# Patient Record
Sex: Male | Born: 1980 | Race: White | Hispanic: No | Marital: Single | Smoking: Former smoker
Health system: Southern US, Community
[De-identification: ages and names within clinical notes are randomized; demographics above are authoritative.]

## PROBLEM LIST (undated history)

## (undated) DIAGNOSIS — F909 Attention-deficit hyperactivity disorder, unspecified type: Secondary | ICD-10-CM

## (undated) DIAGNOSIS — M542 Cervicalgia: Secondary | ICD-10-CM

## (undated) DIAGNOSIS — B351 Tinea unguium: Secondary | ICD-10-CM

## (undated) DIAGNOSIS — S43006A Unspecified dislocation of unspecified shoulder joint, initial encounter: Secondary | ICD-10-CM

## (undated) DIAGNOSIS — H7191 Unspecified cholesteatoma, right ear: Secondary | ICD-10-CM

## (undated) DIAGNOSIS — F419 Anxiety disorder, unspecified: Secondary | ICD-10-CM

## (undated) HISTORY — DX: Cervicalgia: M54.2

## (undated) HISTORY — DX: Tinea unguium: B35.1

## (undated) HISTORY — DX: Attention-deficit hyperactivity disorder, unspecified type: F90.9

## (undated) HISTORY — DX: Unspecified dislocation of unspecified shoulder joint, initial encounter: S43.006A

## (undated) HISTORY — DX: Unspecified cholesteatoma, right ear: H71.91

## (undated) HISTORY — DX: Anxiety disorder, unspecified: F41.9

## (undated) HISTORY — PX: TYMPANOPLASTY W/ MASTOIDECTOMY: SUR1400

## (undated) HISTORY — PX: TONSILLECTOMY: SHX5217

---

## 2007-09-10 ENCOUNTER — Emergency Department (HOSPITAL_COMMUNITY): Admission: EM | Admit: 2007-09-10 | Discharge: 2007-09-11 | Payer: Self-pay | Admitting: Emergency Medicine

## 2008-05-30 ENCOUNTER — Ambulatory Visit: Payer: Self-pay | Admitting: Family Medicine

## 2008-05-30 DIAGNOSIS — S43109A Unspecified dislocation of unspecified acromioclavicular joint, initial encounter: Secondary | ICD-10-CM | POA: Insufficient documentation

## 2008-05-30 DIAGNOSIS — M542 Cervicalgia: Secondary | ICD-10-CM

## 2008-05-30 DIAGNOSIS — H7120 Cholesteatoma of mastoid, unspecified ear: Secondary | ICD-10-CM | POA: Insufficient documentation

## 2008-05-30 DIAGNOSIS — F341 Dysthymic disorder: Secondary | ICD-10-CM

## 2008-05-30 DIAGNOSIS — B351 Tinea unguium: Secondary | ICD-10-CM | POA: Insufficient documentation

## 2008-06-24 ENCOUNTER — Ambulatory Visit: Payer: Self-pay | Admitting: Family Medicine

## 2008-06-24 DIAGNOSIS — F411 Generalized anxiety disorder: Secondary | ICD-10-CM | POA: Insufficient documentation

## 2008-06-24 DIAGNOSIS — L719 Rosacea, unspecified: Secondary | ICD-10-CM | POA: Insufficient documentation

## 2008-06-29 ENCOUNTER — Telehealth: Payer: Self-pay | Admitting: Family Medicine

## 2010-06-02 ENCOUNTER — Ambulatory Visit: Admit: 2010-06-02 | Payer: Self-pay | Admitting: Family Medicine

## 2010-12-29 ENCOUNTER — Ambulatory Visit (HOSPITAL_BASED_OUTPATIENT_CLINIC_OR_DEPARTMENT_OTHER)
Admission: RE | Admit: 2010-12-29 | Discharge: 2010-12-29 | Disposition: A | Discharge status: Home / Self Care | Payer: BC Managed Care – PPO | Source: Ambulatory Visit | Attending: Family Medicine | Admitting: Family Medicine

## 2010-12-29 ENCOUNTER — Ambulatory Visit (INDEPENDENT_AMBULATORY_CARE_PROVIDER_SITE_OTHER): Payer: BC Managed Care – PPO | Admitting: Family Medicine

## 2010-12-29 ENCOUNTER — Encounter: Payer: Self-pay | Admitting: Family Medicine

## 2010-12-29 ENCOUNTER — Ambulatory Visit: Payer: Self-pay | Admitting: Family Medicine

## 2010-12-29 VITALS — BP 112/75 | HR 84 | Temp 97.6°F | Ht 73.0 in | Wt 225.0 lb

## 2010-12-29 DIAGNOSIS — M25519 Pain in unspecified shoulder: Secondary | ICD-10-CM

## 2010-12-29 DIAGNOSIS — M542 Cervicalgia: Secondary | ICD-10-CM

## 2010-12-29 DIAGNOSIS — M47812 Spondylosis without myelopathy or radiculopathy, cervical region: Secondary | ICD-10-CM | POA: Insufficient documentation

## 2010-12-29 DIAGNOSIS — Z87828 Personal history of other (healed) physical injury and trauma: Secondary | ICD-10-CM

## 2010-12-29 DIAGNOSIS — M25512 Pain in left shoulder: Secondary | ICD-10-CM

## 2010-12-29 MED ORDER — CYCLOBENZAPRINE HCL 10 MG PO TABS: 10.0000 mg | ORAL_TABLET | Freq: Three times a day (TID) | ORAL | Status: AC | PRN

## 2010-12-29 MED ORDER — MELOXICAM 15 MG PO TABS: 15.0000 mg | ORAL_TABLET | Freq: Every day | ORAL | Status: AC

## 2010-12-29 NOTE — Patient Instructions (Signed)
Your history and exam are consistent with left shoulder rotator cuff impingement and mild multidirectional instability. Try to avoid painful activities (overhead activities, lifting with extended arm) as much as possible. Meloxicam 15mg  daily with food for pain and inflammation. Physical therapy with transition to home exercise program for rotator cuff strengthening and scapular stabilization exercises. Subacromial injection may be beneficial to help with pain and to decrease inflammation if not improving as expected over next 4-6 weeks. If not improving at follow-up we will consider ultrasound, injection, and/or MRI.  Your neck should improve with physical therapy and anti-inflammatories as well. Your exam and history do not suggest bulging or herniated disc or a pinched nerve at this point. Get the x-rays of your neck - I will call you to go over these. If not improving with physical therapy the next step would be an MRI. Can consider cervical collar if severely painful. Heat 15 minutes at a time 3-4 times a day to help with spasms. Watch head position when on computers, texting, when sleeping in bed - should in line with back to prevent further nerve traction and irritation. Consider home traction unit if you get benefit with this in physical therapy.

## 2010-12-31 ENCOUNTER — Encounter: Payer: Self-pay | Admitting: Family Medicine

## 2010-12-31 DIAGNOSIS — M25512 Pain in left shoulder: Secondary | ICD-10-CM | POA: Insufficient documentation
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
## 2010-12-31 NOTE — Assessment & Plan Note (Signed)
findings and history suggest rotator cuff impingement.  Rotator cuff strength grossly is 5/5 but has + painful arc and neers.  Testing for labral tear, biceps tendinopathy, subluxation, rotator cuff tear negative.  He should do well with nsaid + physical therapy and home exercise program.  Advised him if not improving can consider subacromial cortisone injection in about 4-6 weeks.  See instructions for further.
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
## 2011-02-16 LAB — CBC
Platelets: 278
RBC: 4.98

## 2011-02-16 LAB — DIFFERENTIAL
Basophils Absolute: 0
Basophils Relative: 0
Eosinophils Absolute: 0.1
Eosinophils Relative: 2
Monocytes Absolute: 0.7
Neutro Abs: 4.6

## 2011-02-16 LAB — COMPREHENSIVE METABOLIC PANEL
Albumin: 4.6
Alkaline Phosphatase: 60
BUN: 16
Chloride: 106
Potassium: 4.1
Total Bilirubin: 1.2

## 2011-02-16 LAB — APTT: aPTT: 33

## 2011-09-01 ENCOUNTER — Ambulatory Visit (INDEPENDENT_AMBULATORY_CARE_PROVIDER_SITE_OTHER): Payer: BC Managed Care – PPO | Admitting: Family Medicine

## 2011-09-01 ENCOUNTER — Encounter: Payer: Self-pay | Admitting: Family Medicine

## 2011-09-01 VITALS — BP 110/76 | Temp 98.0°F | Ht 73.0 in | Wt 236.0 lb

## 2011-09-01 DIAGNOSIS — F909 Attention-deficit hyperactivity disorder, unspecified type: Secondary | ICD-10-CM | POA: Insufficient documentation

## 2011-09-01 DIAGNOSIS — G4700 Insomnia, unspecified: Secondary | ICD-10-CM

## 2011-09-01 DIAGNOSIS — M542 Cervicalgia: Secondary | ICD-10-CM

## 2011-09-01 MED ORDER — ZOLPIDEM TARTRATE 5 MG PO TABS: 5.0000 mg | ORAL_TABLET | Freq: Every evening | ORAL | Status: DC | PRN

## 2011-09-01 MED ORDER — AMPHETAMINE-DEXTROAMPHETAMINE 10 MG PO TABS: 10.0000 mg | ORAL_TABLET | Freq: Four times a day (QID) | ORAL | Status: DC

## 2011-09-01 MED ORDER — AMPHETAMINE-DEXTROAMPHETAMINE 10 MG PO TABS: ORAL_TABLET | ORAL | Status: DC
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
## 2011-09-01 NOTE — Patient Instructions (Signed)
Take the Adderall as directed  Ambien 5 mg at bedtime when necessary for sleep  Call 2 weeks from being out of your third prescription for refills  Followup in 1 year sooner if any problems

## 2011-09-08 ENCOUNTER — Ambulatory Visit: Payer: BC Managed Care – PPO | Admitting: Family Medicine

## 2012-02-10 ENCOUNTER — Telehealth: Payer: Self-pay | Admitting: Family Medicine

## 2012-02-10 NOTE — Telephone Encounter (Signed)
Patient called stating that he need a refill of his adderall. Please assist.  °

## 2012-02-14 MED ORDER — AMPHETAMINE-DEXTROAMPHETAMINE 10 MG PO TABS: 10.0000 mg | ORAL_TABLET | Freq: Four times a day (QID) | ORAL | Status: DC

## 2012-02-14 MED ORDER — AMPHETAMINE-DEXTROAMPHETAMINE 10 MG PO TABS: ORAL_TABLET | ORAL | Status: DC

## 2012-02-14 NOTE — Telephone Encounter (Signed)
rx ready for pick up and patient is aware  

## 2012-06-20 ENCOUNTER — Other Ambulatory Visit: Payer: Self-pay | Admitting: Family Medicine

## 2012-06-20 MED ORDER — AMPHETAMINE-DEXTROAMPHETAMINE 10 MG PO TABS: ORAL_TABLET | ORAL | Status: DC

## 2012-06-20 MED ORDER — AMPHETAMINE-DEXTROAMPHETAMINE 10 MG PO TABS: 10.0000 mg | ORAL_TABLET | Freq: Four times a day (QID) | ORAL | Status: DC

## 2012-06-20 NOTE — Telephone Encounter (Signed)
Rx ready for pick up.  Left message on machine for patient. 

## 2012-06-20 NOTE — Telephone Encounter (Signed)
Pt needs new rx generic adderall 10 mg °

## 2012-09-04 ENCOUNTER — Other Ambulatory Visit: Payer: Self-pay | Admitting: *Deleted

## 2012-09-04 ENCOUNTER — Telehealth: Payer: Self-pay | Admitting: *Deleted

## 2012-09-04 MED ORDER — AMPHETAMINE-DEXTROAMPHETAMINE 10 MG PO TABS: 10.0000 mg | ORAL_TABLET | Freq: Four times a day (QID) | ORAL | Status: DC

## 2012-09-04 MED ORDER — AMPHETAMINE-DEXTROAMPHETAMINE 10 MG PO TABS: ORAL_TABLET | ORAL | Status: DC

## 2012-09-04 NOTE — Telephone Encounter (Signed)
Rx ready for pick up and left message on machine 

## 2012-09-18 ENCOUNTER — Telehealth: Payer: Self-pay | Admitting: Family Medicine

## 2012-09-18 NOTE — Telephone Encounter (Signed)
Fleet Contras please clarify what he is asking. The C1 increase the dose to 20 mg!!!!

## 2012-09-18 NOTE — Telephone Encounter (Signed)
Okay to change Adderall to 20 mg twice daily?

## 2012-09-18 NOTE — Telephone Encounter (Signed)
Patient called stating that he need a refill of his adderall and would like to have 20 mg instead of the 10 mg so that he will not have issues with his insurance company when he tries to get more than 60 tabs of the 10 mg. Please advise/assist.

## 2012-09-20 NOTE — Telephone Encounter (Signed)
Patient is taking adderall 10 mg 4 times a day.  He would like the prescription now to say adderall 20 mg twice daily for insurance reasons.  Is this okay?

## 2012-09-21 MED ORDER — AMPHETAMINE-DEXTROAMPHETAMINE 20 MG PO TABS: 20.0000 mg | ORAL_TABLET | Freq: Two times a day (BID) | ORAL | Status: DC

## 2012-09-21 MED ORDER — AMPHETAMINE-DEXTROAMPHETAMINE 20 MG PO TABS: 1.0000 | ORAL_TABLET | Freq: Two times a day (BID) | ORAL | Status: DC

## 2012-09-21 NOTE — Telephone Encounter (Signed)
K

## 2012-09-22 NOTE — Telephone Encounter (Signed)
Rx ready for pick up and Left message on machine for patient   

## 2013-02-05 ENCOUNTER — Telehealth: Payer: Self-pay | Admitting: Family Medicine

## 2013-02-05 NOTE — Telephone Encounter (Signed)
Pt is calling to request a 3 month supply of his amphetamine-dextroamphetamine (ADDERALL) 20 MG tablet. Please assist.

## 2013-02-06 MED ORDER — AMPHETAMINE-DEXTROAMPHETAMINE 20 MG PO TABS: 1.0000 | ORAL_TABLET | Freq: Two times a day (BID) | ORAL | Status: DC

## 2013-02-06 NOTE — Telephone Encounter (Signed)
1 month only ready for pick up.  Patient needs office visit for more refills

## 2013-02-14 ENCOUNTER — Ambulatory Visit (INDEPENDENT_AMBULATORY_CARE_PROVIDER_SITE_OTHER)
Admission: RE | Admit: 2013-02-14 | Discharge: 2013-02-14 | Disposition: A | Discharge status: Home / Self Care | Payer: BC Managed Care – PPO | Source: Ambulatory Visit | Attending: Family | Admitting: Family

## 2013-02-14 ENCOUNTER — Ambulatory Visit (INDEPENDENT_AMBULATORY_CARE_PROVIDER_SITE_OTHER): Payer: BC Managed Care – PPO | Admitting: Family

## 2013-02-14 ENCOUNTER — Encounter: Payer: Self-pay | Admitting: Family

## 2013-02-14 VITALS — BP 120/76 | HR 82 | Wt 236.0 lb

## 2013-02-14 DIAGNOSIS — M25569 Pain in unspecified knee: Secondary | ICD-10-CM

## 2013-02-14 DIAGNOSIS — M2550 Pain in unspecified joint: Secondary | ICD-10-CM

## 2013-02-14 DIAGNOSIS — M25562 Pain in left knee: Secondary | ICD-10-CM

## 2013-02-14 DIAGNOSIS — R6882 Decreased libido: Secondary | ICD-10-CM

## 2013-02-14 DIAGNOSIS — F988 Other specified behavioral and emotional disorders with onset usually occurring in childhood and adolescence: Secondary | ICD-10-CM

## 2013-02-14 LAB — CBC WITH DIFFERENTIAL/PLATELET
Basophils Absolute: 0 10*3/uL (ref 0.0–0.1)
Eosinophils Absolute: 0.3 10*3/uL (ref 0.0–0.7)
HCT: 46.3 % (ref 39.0–52.0)
Hemoglobin: 15.9 g/dL (ref 13.0–17.0)
Lymphs Abs: 2.6 10*3/uL (ref 0.7–4.0)
MCHC: 34.4 g/dL (ref 30.0–36.0)
MCV: 88.6 fl (ref 78.0–100.0)
Monocytes Absolute: 0.6 10*3/uL (ref 0.1–1.0)
Neutro Abs: 4 10*3/uL (ref 1.4–7.7)
RDW: 12.9 % (ref 11.5–14.6)

## 2013-02-14 LAB — BASIC METABOLIC PANEL
BUN: 15 mg/dL (ref 6–23)
Calcium: 9.3 mg/dL (ref 8.4–10.5)
Creatinine, Ser: 1 mg/dL (ref 0.4–1.5)

## 2013-02-14 MED ORDER — AMPHETAMINE-DEXTROAMPHETAMINE 20 MG PO TABS: 1.0000 | ORAL_TABLET | Freq: Two times a day (BID) | ORAL | Status: DC

## 2013-02-14 MED ORDER — AMPHETAMINE-DEXTROAMPHETAMINE 20 MG PO TABS: 20.0000 mg | ORAL_TABLET | Freq: Two times a day (BID) | ORAL | Status: DC

## 2013-02-14 NOTE — Patient Instructions (Signed)
Wear and Tear Disorders of the Knee (Arthritis, Osteoarthritis)  Everyone will experience wear and tear injuries (arthritis, osteoarthritis) of the knee. These are the changes we all get as we age. They come from the joint stress of daily living. The amount of cartilage damage in your knee and your symptoms determine if you need surgery. Mild problems require approximately two months recovery time. More severe problems take several months to recover. With mild problems, your surgeon may find worn and rough cartilage surfaces. With severe changes, your surgeon may find cartilage that has completely worn away and exposed the bone. Loose bodies of bone and cartilage, bone spurs (excess bone growth), and injuries to the menisci (cushions between the large bones of your leg) are also common. All of these problems can cause pain.  For a mild wear and tear problem, rough cartilage may simply need to be shaved and smoothed. For more severe problems with areas of exposed bone, your surgeon may use an instrument for roughing up the bone surfaces to stimulate new cartilage growth. Loose bodies are usually removed. Torn menisci may be trimmed or repaired.  ABOUT THE ARTHROSCOPIC PROCEDURE  Arthroscopy is a surgical technique. It allows your orthopedic surgeon to diagnose and treat your knee injury with accuracy. The surgeon looks into your knee through a small scope. The scope is like a small (pencil-sized) telescope. Arthroscopy is less invasive than open knee surgery. You can expect a more rapid recovery. After the procedure, you will be moved to a recovery area until most of the effects of the medication have worn off. Your caregiver will discuss the test results with you.  RECOVERY  The severity of the arthritis and the type of procedure performed will determine recovery time. Other important factors include age, physical condition, medical conditions, and the type of rehabilitation program. Strengthening your muscles after  arthroscopy helps guarantee a better recovery. Follow your caregiver's instructions. Use crutches, rest, elevate, ice, and do knee exercises as instructed. Your caregivers will help you and instruct you with exercises and other physical therapy required to regain your mobility, muscle strength, and functioning following surgery. Only take over-the-counter or prescription medicines for pain, discomfort, or fever as directed by your caregiver.   SEEK MEDICAL CARE IF:   · There is increased bleeding (more than a small spot) from the wound.  · You notice redness, swelling, or increasing pain in the wound.  · Pus is coming from wound.  · You develop an unexplained oral temperature above 102° F (38.9° C) , or as your caregiver suggests.  · You notice a foul smell coming from the wound or dressing.  · You have severe pain with motion of the knee.  SEEK IMMEDIATE MEDICAL CARE IF:   · You develop a rash.  · You have difficulty breathing.  · You have any allergic problems.  MAKE SURE YOU:   · Understand these instructions.  · Will watch your condition.  · Will get help right away if you are not doing well or get worse.  Document Released: 05/07/2000 Document Revised: 08/02/2011 Document Reviewed: 10/04/2007  [employer]® Patient Information ©2014 [employer].

## 2013-02-14 NOTE — Progress Notes (Signed)
Subjective:    Patient ID: Gilbert Carroll, male    DOB: Feb 01, 1981, 32 y.o.   MRN: [id number]  HPI  And 32 year old white male, nonsmoker, patient of Dr. Tawanna Cooler is in today with complaints of left knee pain off and on for several months. The pain is especially worse first thing in the morning rates it a 2/10, described as a teen. Has taken Advil in the past and is unsure of her really helped much. 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 Also complains of pain in his hands bilaterally first thing in the morning, describes more stiffness and pain. Denies any injury. Has a family history significant for osteoarthritis her mother and father's side.  Patient has a history of attention deficit disorders requesting refills on Adderall. He is tolerating the medication well. He is a recent graduate from law school. Took the bar once and did not pass is planning to take it again.  Patient has concerns of decreased libido recently. Reports a history in the past of low testosterone. Was seeing endocrinology but has not seen him in quite some time. We'll like to have his testosterone checked today. Denies any inability to achieve or maintain an erection. Does report decreased in the tone of his erections.  Review of Systems  Constitutional: Negative.   Respiratory: Negative.   Cardiovascular: Negative.   Gastrointestinal: Negative.   Genitourinary: Negative for dysuria, scrotal swelling, difficulty urinating and testicular pain.       Decreased libido  Musculoskeletal: Positive for arthralgias. Negative for back pain.  Skin: Negative.   Neurological: Negative.   Hematological: Negative.   Psychiatric/Behavioral: Negative.    Past Medical History  Diagnosis Date  . ADD (attention deficit disorder with hyperactivity)   . Anxiety   . Dislocation, shoulder     right  . Neck pain     chronic  . Cholesteatoma of right ear   . Onychomycosis     History   Social History  . Marital Status: Single     Spouse Name: N/A    Number of Children: N/A  . Years of Education: N/A   Occupational History  . Not on file.   Social History Main Topics  . Smoking status: Former Games developer  . Smokeless tobacco: Current User  . Alcohol Use: Yes  . Drug Use: No  . Sexual Activity: Not on file   Other Topics Concern  . Not on file   Social History Narrative  . No narrative on file    Past Surgical History  Procedure Laterality Date  . Tympanoplasty w/ mastoidectomy    . Tonsillectomy      Family History  Problem Relation Age of Onset  . Diabetes Mother   . Heart attack Maternal Grandmother   . Hyperlipidemia Maternal Grandmother   . Hypertension Maternal Grandmother   . Heart attack Paternal Grandfather   . Sudden death Neg Hx     No Known Allergies  Current Outpatient Prescriptions on File Prior to Visit  Medication Sig Dispense Refill  . zolpidem (AMBIEN) 10 MG tablet Take 10 mg by mouth at bedtime as needed.        . zolpidem (AMBIEN) 5 MG tablet Take 1 tablet (5 mg total) by mouth at bedtime as needed for sleep.  30 tablet  2   No current facility-administered medications on file prior to visit.    BP 120/76  Pulse 82  Wt 236 lb (107.049 kg)  BMI 31.14 kg/m2chart    Objective:  Physical Exam  Constitutional: He is oriented to person, place, and time. He appears well-developed and well-nourished.  HENT:  Right Ear: External ear normal.  Left Ear: External ear normal.  Nose: Nose normal.  Mouth/Throat: Oropharynx is clear and moist.  Neck: Normal range of motion. Neck supple.  Cardiovascular: Normal rate, regular rhythm and normal heart sounds.   Pulmonary/Chest: Effort normal and breath sounds normal.  Abdominal: Soft. Bowel sounds are normal.  Musculoskeletal: Normal range of motion.  Neurological: He is alert and oriented to person, place, and time.  Skin: Skin is warm and dry.  Psychiatric: He has a normal mood and affect.          Assessment & Plan:   Assessment: 1. Left knee pain 2. Osteoarthritis 3. Attention deficit disorder 4. Decreased libido  Plan: Labs sent to include BMP, CBC, testosterone level will notify patient of results. Renewed Adderall prescription dated 03/08/2013, 04/08/2013, and 05/08/2013. X-ray of the left knee will notify patient of results. Followup in 3 months and sooner as needed. Consider anti-inflammatory for joint pain.

## 2013-02-15 ENCOUNTER — Telehealth: Payer: Self-pay | Admitting: Family Medicine

## 2013-02-15 ENCOUNTER — Other Ambulatory Visit: Payer: Self-pay

## 2013-02-15 DIAGNOSIS — E291 Testicular hypofunction: Secondary | ICD-10-CM

## 2013-02-15 NOTE — Telephone Encounter (Signed)
pls call pt back and give "details" of labs.

## 2013-02-15 NOTE — Telephone Encounter (Signed)
Left detailed message on personally identified voicemail to notify of results with normal range

## 2013-02-21 ENCOUNTER — Encounter: Payer: Self-pay | Admitting: Endocrinology

## 2013-02-21 ENCOUNTER — Ambulatory Visit (INDEPENDENT_AMBULATORY_CARE_PROVIDER_SITE_OTHER): Payer: BC Managed Care – PPO | Admitting: Endocrinology

## 2013-02-21 VITALS — BP 110/72 | HR 83 | Temp 98.3°F | Resp 10 | Ht 73.0 in | Wt 227.0 lb

## 2013-02-21 DIAGNOSIS — R5381 Other malaise: Secondary | ICD-10-CM

## 2013-02-21 DIAGNOSIS — R7989 Other specified abnormal findings of blood chemistry: Secondary | ICD-10-CM | POA: Insufficient documentation

## 2013-02-21 DIAGNOSIS — E291 Testicular hypofunction: Secondary | ICD-10-CM

## 2013-02-21 NOTE — Progress Notes (Addendum)
Patient ID: Gilbert Carroll, male   DOB: 24-Jul-1980, 32 y.o.   MRN: [id number]  History of Present Illness  HypogonadismWas diagnosed in  about 4-5 years ago Several years ago he was taking nonprescription steroid supplements for muscle building After he stopped the supplements his testosterone was relatively low but no details available. He thinks he was given clomiphene for this   About 2 years ago his testosterone level was low normal, at that time he was not having significant symptoms but was having routine lab work checked. He was also evaluated by an endocrinologist but did not think any further testing was done He did not followup at that point For the last year he has been complaining about decreased libido levels. Also he has started to notice more fatigue and lack of motivation and drive. He also has difficulty sleeping but he thinks he gets tired even when he has rested well. Does not think he has depression. He feels that he has decreased muscle strength And also increased body fat. Has not been exercising recently except for doing some lifting boxes His weight has stayed about the same for the last year and a half  He denies the following: Height loss, low trauma fracture, low bone mineral density Hot flushes, sweats, breast sensitive no enlargement, long term steroid use, history of testicular injury, mumps in childhood.   The lab findings are as follows:   Testosterone level drawn at about 2 PM:  Lab Results  Component Value Date   TESTOSTERONE 239.34* 02/14/2013     Medication List       This list is accurate as of: 02/21/13  1:32 PM.  Always use your most recent med list.               AMBIEN 10 MG tablet  Generic drug:  zolpidem  Take 10 mg by mouth at bedtime as needed.     amphetamine-dextroamphetamine 20 MG tablet  Commonly known as:  ADDERALL  Take 1 tablet (20 mg total) by mouth 2 (two) times daily.        Allergies: No Known Allergies  Past  Medical History  Diagnosis Date  . ADD (attention deficit disorder with hyperactivity)   . Anxiety   . Dislocation, shoulder     right  . Neck pain     chronic  . Cholesteatoma of right ear   . Onychomycosis     Past Surgical History  Procedure Laterality Date  . Tympanoplasty w/ mastoidectomy    . Tonsillectomy      Family History  Problem Relation Age of Onset  . Diabetes Mother   . Heart attack Maternal Grandmother   . Hyperlipidemia Maternal Grandmother   . Hypertension Maternal Grandmother   . Heart attack Paternal Grandfather   . Sudden death Neg Hx     Social History:  reports that he has quit smoking. He uses smokeless tobacco. He reports that  drinks alcohol. He reports that he does not use illicit drugs.  REVIEW of systems:  No history of high blood pressure No history of diabetes He thinks his cholesterol has been high at one point but results not available Legs may feel like they have fallen asleep in am on waking up but no other numbness No history of constipation  General Examination:   GENERAL APPEARANCE: Large build, not obese  SKIN:normal, no rash or pigmentation.  HEENT:Oral mucosa normal. EYES:normal external appearance of eyes, Fundi benign.   NECK:no lymphadenopathy, no thyromegaly. Chest:  No gynecomastia  LUNGS:clear to auscultation bilaterally, no wheezes, rhonchi, rales.  HEART:normal S1 And S2, no S3, S4, murmur or click.  ABDOMEN:no hepatosplenomegaly, no masses palpated, soft and not tender.  MALE GENITOURINARY:Testicles are normal in size, about 4 cm MUSCULOSKELETALNo enlargement  of joints.  EXTREMITIES:no clubbing, no edema.  NEUROLOGIC EXAM: Biceps reflexes normal (2+) bilaterally.   Assessment/ Plan:  Low testosterone level: Not clear if he is having persistently low testosterone levels because of prior use of androgenic steroids in the past  He does  complain of significant symptoms of decreased libido, fatigue, decreased muscle strength and motivation over the last year or so. However he is also having  issues with insomnia. Denies depression at this time although has had this before  Since his testosterone level was done in the afternoon and is only mildly reduced will need to repeat this fasting and also get a better assessment of the level with a free testosterone level Discussed with him the need to identify the etiology of his hypogonadism with doing an LH level and prolactin to evaluate pituitary function Also will need to check free T4 and TSH to rule out hypothyroidism as cause for symptoms  Have discussed with him the treatment options if he does appear to have hypogonadotropic hypogonadism. Since he is interested in fertility next couple of years would recommend that he try clomiphene instead of testosterone supplements which would inhibit spermatogenesis. Discussed benefits, possible side effects and effects of testosterone supplementation   Gilbert Carroll  02/26/13: Addendum:  Fasting lab work shows normal free testosterone level of 76. Other labs are quite normal also. Would not recommend testosterone supplementation with normal levels and his degree of symptomatology is not proportional to his testosterone levels. Also indicated that his insurance company will deny testosterone supplementation anyway.  He may use clomiphene if fertility and low sperm count is an issue in the future. Discussed with the patient.

## 2013-02-21 NOTE — Patient Instructions (Signed)
Treatment to be decided after labs complete

## 2013-02-22 ENCOUNTER — Ambulatory Visit: Payer: BC Managed Care – PPO

## 2013-02-22 ENCOUNTER — Other Ambulatory Visit: Payer: Self-pay | Admitting: *Deleted

## 2013-02-22 DIAGNOSIS — R7989 Other specified abnormal findings of blood chemistry: Secondary | ICD-10-CM

## 2013-02-22 DIAGNOSIS — E291 Testicular hypofunction: Secondary | ICD-10-CM

## 2013-02-22 DIAGNOSIS — R5381 Other malaise: Secondary | ICD-10-CM

## 2013-02-22 LAB — PROLACTIN: Prolactin: 7.3 ng/mL (ref 2.1–17.1)

## 2013-02-23 LAB — TESTOSTERONE, FREE, TOTAL, SHBG
Sex Hormone Binding: 30 nmol/L (ref 13–71)
Testosterone, Free: 67.8 pg/mL (ref 47.0–244.0)
Testosterone-% Free: 2.1 % (ref 1.6–2.9)
Testosterone: 324 ng/dL (ref 300–890)

## 2013-02-23 LAB — TSH: TSH: 1.417 u[IU]/mL (ref 0.350–4.500)

## 2013-02-26 ENCOUNTER — Telehealth: Payer: Self-pay | Admitting: *Deleted

## 2013-02-26 ENCOUNTER — Ambulatory Visit: Payer: BC Managed Care – PPO | Admitting: Endocrinology

## 2013-02-26 NOTE — Telephone Encounter (Signed)
Pt spoke with Dr. Lucianne Muss

## 2013-07-31 ENCOUNTER — Telehealth: Payer: Self-pay | Admitting: Family Medicine

## 2013-07-31 MED ORDER — AMPHETAMINE-DEXTROAMPHETAMINE 20 MG PO TABS: 20.0000 mg | ORAL_TABLET | Freq: Two times a day (BID) | ORAL | Status: DC

## 2013-07-31 NOTE — Telephone Encounter (Signed)
Rx ready for pick up and patient is aware 

## 2013-07-31 NOTE — Telephone Encounter (Signed)
Pt request new rx 3 mo amphetamine-dextroamphetamine (ADDERALL) 20 MG tablet

## 2013-12-10 ENCOUNTER — Telehealth: Payer: Self-pay | Admitting: Family Medicine

## 2013-12-10 NOTE — Telephone Encounter (Signed)
Pt needs new rx generic adderall 20 mg °

## 2013-12-11 MED ORDER — AMPHETAMINE-DEXTROAMPHETAMINE 20 MG PO TABS: 20.0000 mg | ORAL_TABLET | Freq: Two times a day (BID) | ORAL | Status: DC

## 2013-12-11 NOTE — Telephone Encounter (Signed)
Rx ready for pick up and Left message on machine for patient   

## 2014-05-07 ENCOUNTER — Telehealth: Payer: Self-pay | Admitting: Family Medicine

## 2014-05-07 NOTE — Telephone Encounter (Signed)
Pt need new rx generic adderall 20 mg °

## 2014-05-08 MED ORDER — AMPHETAMINE-DEXTROAMPHETAMINE 20 MG PO TABS: 20.0000 mg | ORAL_TABLET | Freq: Two times a day (BID) | ORAL | Status: DC

## 2014-05-08 NOTE — Telephone Encounter (Signed)
30 day only given.  Patient must have an office visit for more refills.  Rx ready for pick up and patient is aware.

## 2014-06-21 IMAGING — CR DG KNEE COMPLETE 4+V*L*
4 series · 4 of 4 positions shown · non-contrast
Comparison: None.

CLINICAL DATA: Left knee pain

EXAM:
LEFT KNEE - COMPLETE 4+ VIEW

[view not recorded (1 of 4)]
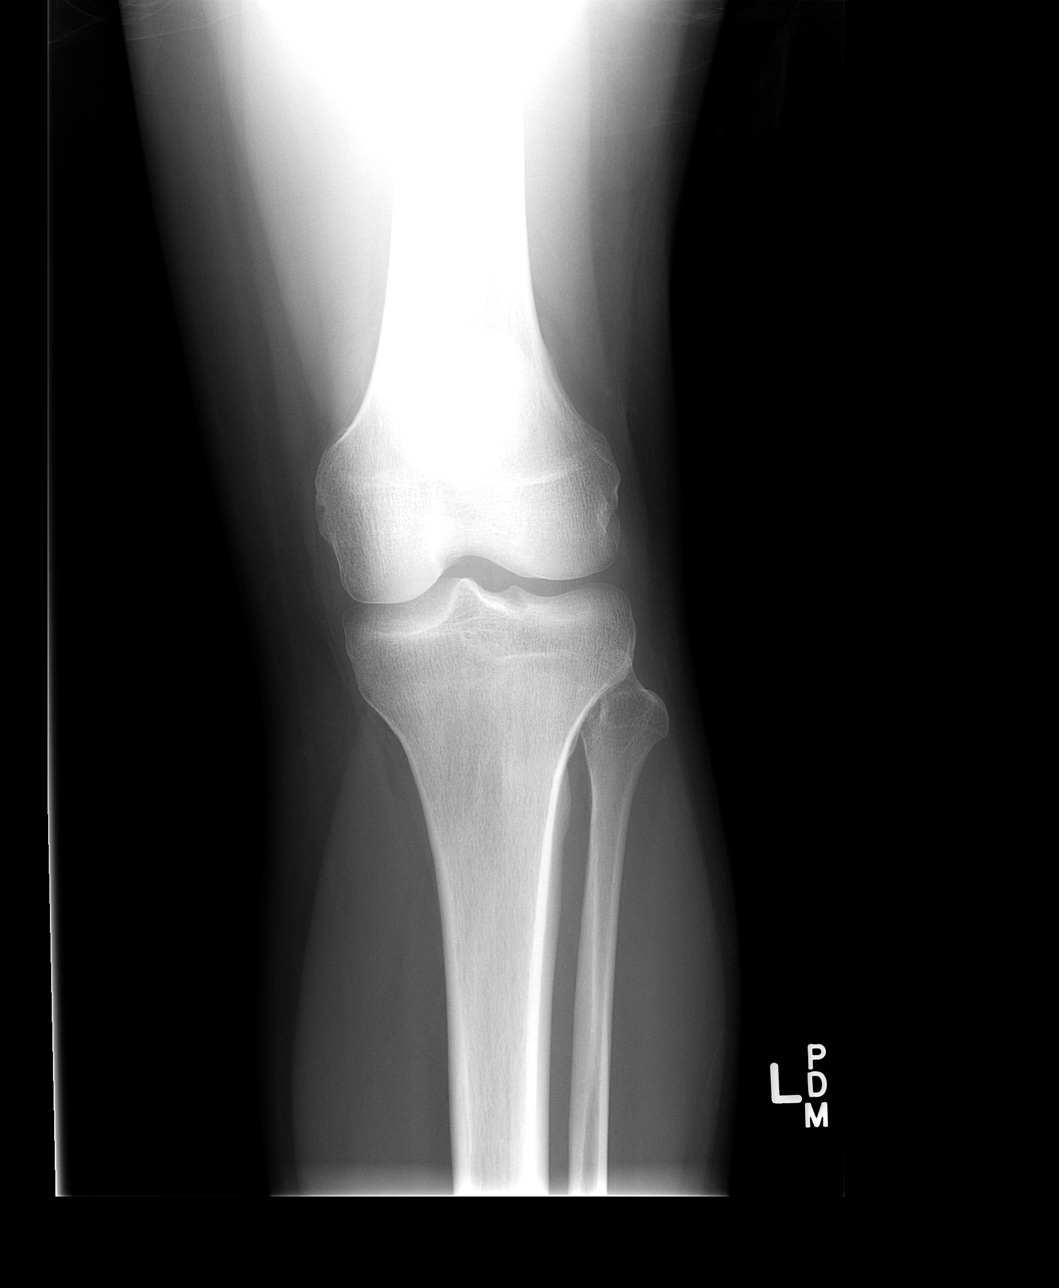

[view not recorded (2 of 4)]
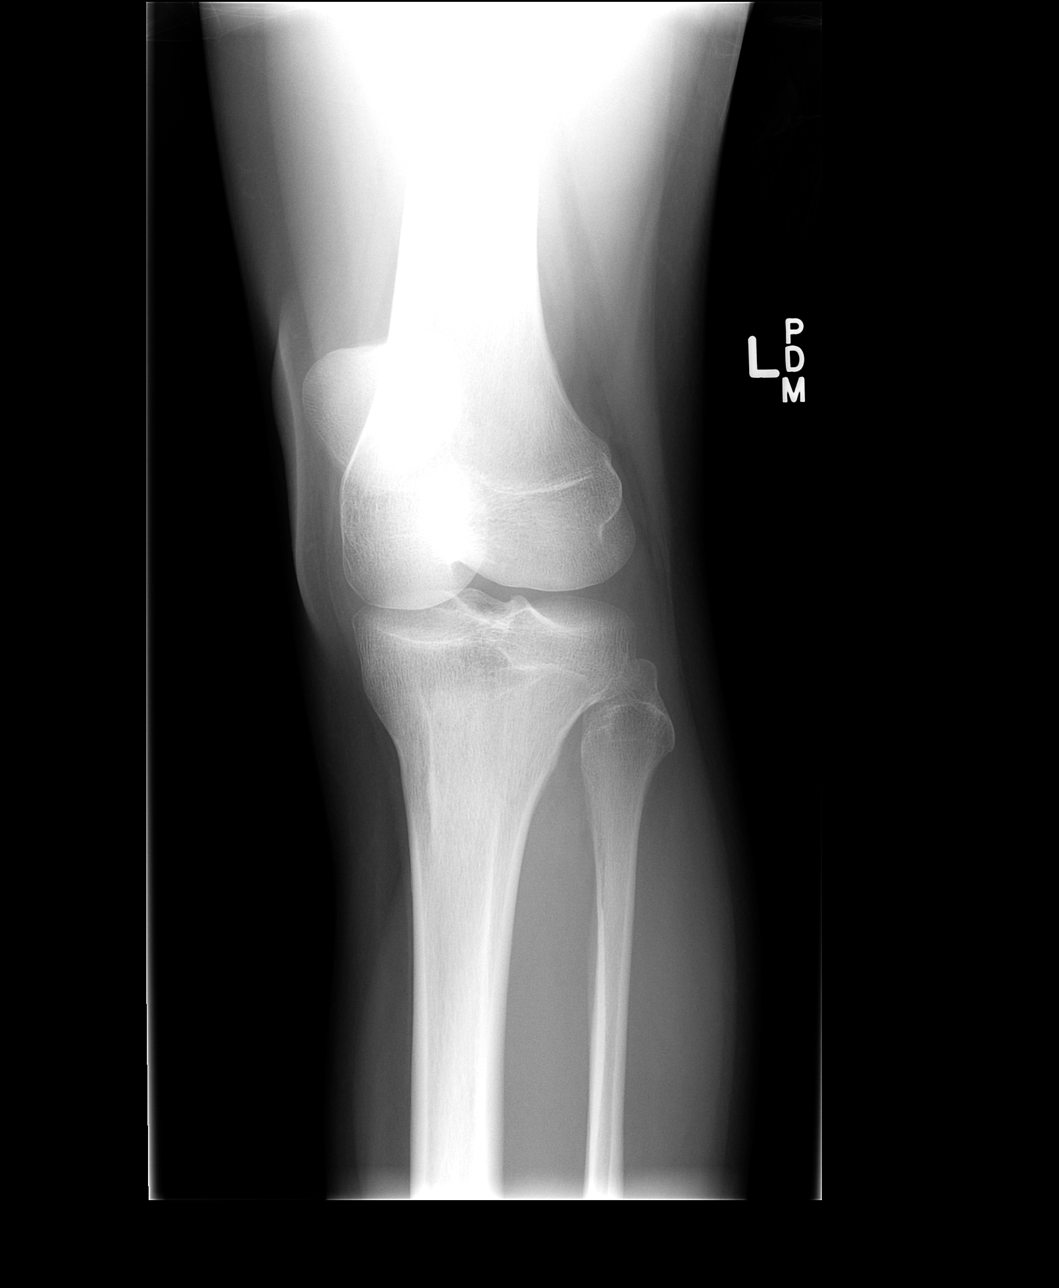

[view not recorded (3 of 4)]
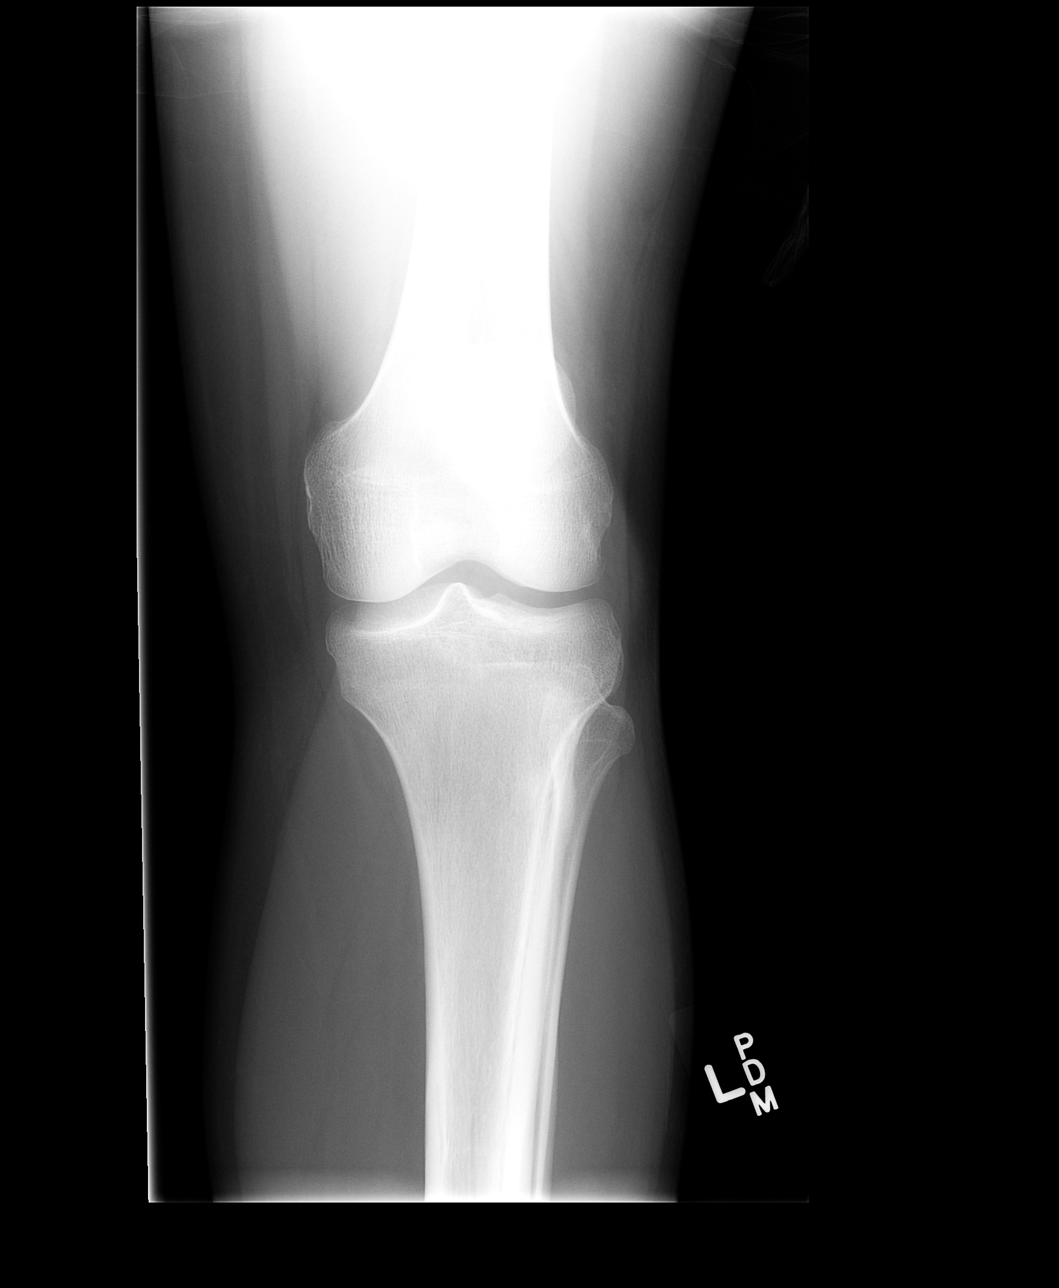

[view not recorded (4 of 4)]
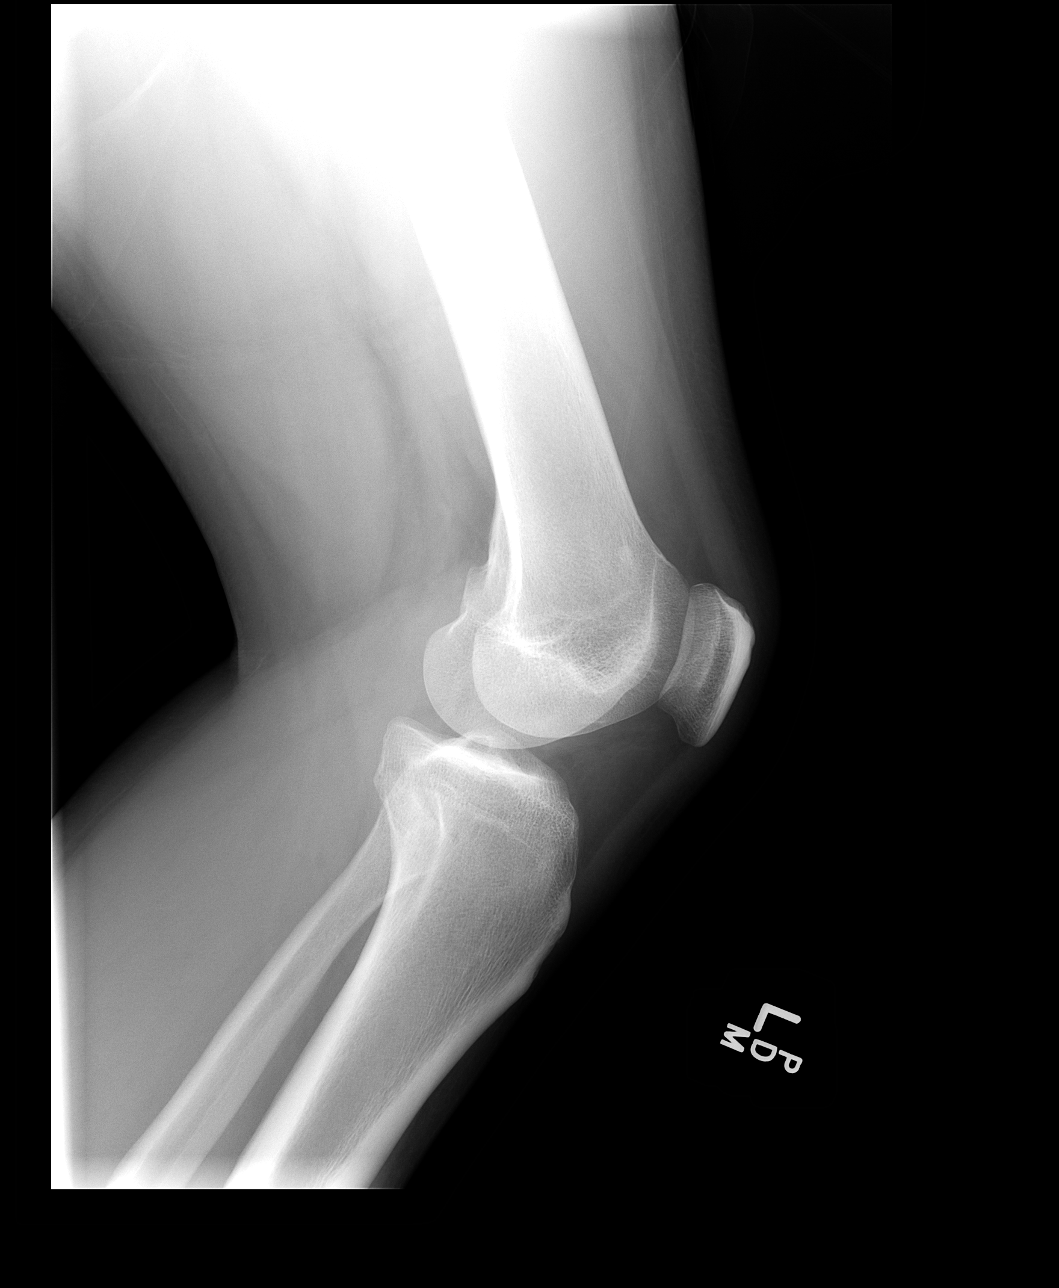

[4 of 4 positions shown; findings below may reference images not displayed]

FINDINGS: No fracture or dislocation is seen.

The joint spaces are preserved.

The visualized soft tissues are unremarkable.

No suprapatellar knee joint effusion.
IMPRESSION: No fracture or dislocation is seen.

## 2014-07-29 ENCOUNTER — Telehealth: Payer: Self-pay | Admitting: Family Medicine

## 2014-07-29 NOTE — Telephone Encounter (Signed)
Per note from 05/08/14: 30 day only given. Patient must have an office visit for more refills. Rx ready for pick up and patient is aware.  Pt has not been seen yet. Please call pt to schedule

## 2014-07-29 NOTE — Telephone Encounter (Signed)
Pt requesting refill of amphetamine-dextroamphetamine (ADDERALL) 20 MG tablet 

## 2014-07-30 NOTE — Telephone Encounter (Signed)
lmom for pt to sch appt °

## 2014-08-01 ENCOUNTER — Ambulatory Visit (INDEPENDENT_AMBULATORY_CARE_PROVIDER_SITE_OTHER): Payer: BLUE CROSS/BLUE SHIELD | Admitting: Family Medicine

## 2014-08-01 ENCOUNTER — Encounter: Payer: Self-pay | Admitting: Family Medicine

## 2014-08-01 VITALS — BP 130/90 | Wt 242.0 lb

## 2014-08-01 DIAGNOSIS — R7989 Other specified abnormal findings of blood chemistry: Secondary | ICD-10-CM

## 2014-08-01 DIAGNOSIS — E291 Testicular hypofunction: Secondary | ICD-10-CM

## 2014-08-01 DIAGNOSIS — F900 Attention-deficit hyperactivity disorder, predominantly inattentive type: Secondary | ICD-10-CM

## 2014-08-01 DIAGNOSIS — F411 Generalized anxiety disorder: Secondary | ICD-10-CM

## 2014-08-01 MED ORDER — ZOLPIDEM TARTRATE 5 MG PO TABS: 5.0000 mg | ORAL_TABLET | Freq: Every evening | ORAL | Status: AC | PRN

## 2014-08-01 MED ORDER — AMPHETAMINE-DEXTROAMPHETAMINE 20 MG PO TABS: 20.0000 mg | ORAL_TABLET | Freq: Two times a day (BID) | ORAL | Status: DC

## 2014-08-01 MED ORDER — AMPHETAMINE-DEXTROAMPHETAMINE 20 MG PO TABS
20.0000 mg | ORAL_TABLET | Freq: Two times a day (BID) | ORAL | Status: DC
Start: 2014-08-01 — End: 2014-12-18

## 2014-08-01 NOTE — Progress Notes (Signed)
Pre visit review using our clinic review tool, if applicable. No additional management support is needed unless otherwise documented below in the visit note. 

## 2014-08-01 NOTE — Patient Instructions (Signed)
Adderall 20 mg....... one twice daily  Call 2 weeks from being out of your third prescription...Marland Kitchen.Marland Kitchen.Marland Kitchen. Rachel's extension is [phone]....... for refills  Return in one year for follow-up  Dr. Crecencio McLES BORDEN,,,,,,,,, urologist for second opinion regarding low testosterone  Ambien 5 mg.........Marland Kitchen. 1 at bedtime when necessary

## 2014-08-01 NOTE — Progress Notes (Signed)
   Subjective:    Patient ID: Gilbert Carroll, male    DOB: October 25, 1980, 34 y.o.   MRN: [id number]  HPI Gilbert Carroll is a 34 year old male who comes in today for evaluation of 3 issues  He has a history of adult ADD and is on Adderall 20 mg twice a day. He's been out of his medicine for couple days and has had a lot of trouble focusing and concentrating. He would like to restart his medication.  He takes Ambien 10 mg.......Marland Kitchen. recommend only 5....... at bedtime when necessary for sleep dysfunction.  He went to see an endocrinologist last year because of fatigue no energy decreased libido and was told he had a normal testosterone range even though it a dropped from the 800s down in the 200s over couple years. The endocrinologist reassured him he was normal. He would like a second opinion   Review of Systems    review of systems negative Objective:   Physical Exam  Well-developed well-nourished male no acute distress vital signs stable he is afebrile      Assessment & Plan:  Adult ADD........ continue Adderall regular 20 mg twice a day  Sleep dysfunction occasional......Marland Kitchen. Ambien 5 mg daily at bedtime when necessary  Decreased libido low testosterone......... urology consult Dr. Laverle PatterBorden

## 2014-08-02 NOTE — Telephone Encounter (Signed)
Pt has already been seen

## 2014-12-17 ENCOUNTER — Telehealth: Payer: Self-pay | Admitting: Family Medicine

## 2014-12-17 NOTE — Telephone Encounter (Signed)
Pt needs new rx generic adderall 20 mg °

## 2014-12-18 MED ORDER — AMPHETAMINE-DEXTROAMPHETAMINE 20 MG PO TABS: 20.0000 mg | ORAL_TABLET | Freq: Two times a day (BID) | ORAL | Status: AC

## 2014-12-18 NOTE — Telephone Encounter (Signed)
Patient is aware rx ready for pick up
